# Patient Record
Sex: Male | Born: 2000 | Race: White | Hispanic: No | Marital: Single
Health system: Southern US, Community
[De-identification: ages and names within clinical notes are randomized; demographics above are authoritative.]

---
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
## 2020-06-09 ENCOUNTER — Ambulatory Visit: Payer: PRIVATE HEALTH INSURANCE | Attending: Internal Medicine

## 2020-06-09 DIAGNOSIS — Z20822 Contact with and (suspected) exposure to covid-19: Secondary | ICD-10-CM | POA: Insufficient documentation

## 2020-06-11 LAB — NOVEL CORONAVIRUS, NAA

## 2023-02-12 ENCOUNTER — Encounter (HOSPITAL_BASED_OUTPATIENT_CLINIC_OR_DEPARTMENT_OTHER): Payer: Self-pay | Admitting: Internal Medicine

## 2023-02-12 DIAGNOSIS — G4733 Obstructive sleep apnea (adult) (pediatric): Secondary | ICD-10-CM

## 2023-03-31 ENCOUNTER — Encounter: Payer: Self-pay | Admitting: Nurse Practitioner

## 2023-03-31 ENCOUNTER — Ambulatory Visit
Admission: EM | Admit: 2023-03-31 | Discharge: 2023-03-31 | Disposition: A | Discharge status: Home / Self Care | Attending: Family Medicine | Admitting: Family Medicine

## 2023-03-31 DIAGNOSIS — J101 Influenza due to other identified influenza virus with other respiratory manifestations: Secondary | ICD-10-CM

## 2023-03-31 LAB — POCT INFLUENZA A/B
Influenza A, POC: POSITIVE — AB
Influenza B, POC: NEGATIVE

## 2023-03-31 MED ORDER — OSELTAMIVIR PHOSPHATE 75 MG PO CAPS: 75.0000 mg | ORAL_CAPSULE | Freq: Two times a day (BID) | ORAL | 0 refills | Status: AC

## 2023-03-31 MED ORDER — ONDANSETRON 4 MG PO TBDP
4.0000 mg | ORAL_TABLET | Freq: Once | ORAL | Status: AC
  Administered 2023-03-31: 4 mg via ORAL

## 2023-03-31 MED ORDER — ONDANSETRON 4 MG PO TBDP: 4.0000 mg | ORAL_TABLET | Freq: Three times a day (TID) | ORAL | 0 refills | Status: AC | PRN

## 2023-03-31 NOTE — Discharge Instructions (Signed)
 You have tested positive for influenza A.  Start Tamiflu twice daily for 5 days.  You may continue Zofran every 8 hours as needed for nausea or vomiting.  You were given a dose while in the clinic.  Focus on hydration/electrolyte replacement with Gatorade, Powerade, Pedialyte, water.  Lots of rest.  Follow-up with your PCP in 2 to 3 days for recheck.  Please go to the ER for any worsening symptoms.  Hope you feel better soon!

## 2023-03-31 NOTE — ED Triage Notes (Signed)
 Patient presents to UC for n/v since this morning. States he went to work and had three episodes of vomiting. He states he felt like he was seizing and felt like he could not breathe. No OTC meds.

## 2023-03-31 NOTE — ED Provider Notes (Signed)
 [facility]    CSN: [id number] Arrival date & time: 03/31/23  7829      History   Chief Complaint Chief Complaint  Patient presents with   Nausea   Emesis    HPI Kevin Mcdaniel is a 23 y.o. male  presents for evaluation of URI symptoms that started today.  Patient reports associated symptoms of 3 episodes of nonbilious nonbloody vomiting.  States he had some upset stomach last night with diarrhea but no diarrhea today.  Reports after the vomiting episodes he felt very shaky like he was "seizing" but denies actual seizure activity.  Denies fevers, cough, congestion, sore throat, body aches, shortness of breath.  Denies any recent travel.  No ingestion of contaminated or undercooked foods.  No known sick contacts.  No history of GI diagnoses such as Crohn's, IBS, colitis, diverticulitis or PUD.  Pt has taken nothing OTC for symptoms. Pt has no other concerns at this time.    Emesis Associated symptoms: diarrhea     History reviewed. No pertinent past medical history.  Patient Active Problem List   Diagnosis Date Noted   Conductive hearing loss of right ear with unrestricted hearing of left ear 08/26/2019   Non-recurrent acute serous otitis media of right ear 08/26/2019    History reviewed. No pertinent surgical history.     Home Medications    Prior to Admission medications   Medication Sig Start Date End Date Taking? Authorizing Provider  atomoxetine (STRATTERA) 25 MG capsule TAKE 1 DAILY-NOTIFY PRESCRIBER IMMEDIATELY IF WORSENING SYMPTOMS OR ADVERSE REACTIONS 02/22/22  Yes [provider]  escitalopram (LEXAPRO) 20 MG tablet Take by mouth. 04/10/22  Yes [provider]  lansoprazole (PREVACID) 30 MG capsule Take 1 tablet by mouth daily. 07/28/19  Yes [provider]  ondansetron (ZOFRAN-ODT) 4 MG disintegrating tablet Take 1 tablet (4 mg total) by mouth every 8 (eight) hours as needed for nausea or vomiting. 03/31/23  Yes Radford Pax, NP  oseltamivir (TAMIFLU) 75 MG capsule Take 1 capsule (75 mg total) by mouth every 12 (twelve) hours. 03/31/23  Yes Radford Pax, NP    Family History History reviewed. No pertinent family history.  Social History     Allergies   Patient has no allergy information on record.   Review of Systems Review of Systems  Gastrointestinal:  Positive for diarrhea, nausea and vomiting.     Physical Exam Triage Vital Signs ED Triage Vitals [03/31/23 0921]  Encounter Vitals Group     BP 101/65     Systolic BP Percentile      Diastolic BP Percentile      Pulse Rate 74     Resp 18     Temp 97.8 F (36.6 C)     Temp Source Oral     SpO2 97 %     Weight      Height      Head Circumference      Peak Flow      Pain Score 0     Pain Loc      Pain Education      Exclude from Growth Chart    No data found.  Updated Vital Signs BP 101/65 (BP Location: Right Arm)   Pulse 74   Temp 97.8 F (36.6 C) (Oral)   Resp 18   SpO2 97%   Visual Acuity Right Eye Distance:   Left Eye Distance:   Bilateral Distance:    Right Eye Near:  Left Eye Near:    Bilateral Near:     Physical Exam Vitals and nursing note reviewed.  Constitutional:      General: He is not in acute distress.    Appearance: Normal appearance. He is not ill-appearing.  HENT:     Head: Normocephalic and atraumatic.  Eyes:     Pupils: Pupils are equal, round, and reactive to light.  Cardiovascular:     Rate and Rhythm: Normal rate.  Pulmonary:     Effort: Pulmonary effort is normal.  Abdominal:     General: Bowel sounds are normal. There is no distension.     Palpations: Abdomen is soft.     Tenderness: There is no abdominal tenderness. There is no guarding or rebound.  Skin:    General: Skin is warm and dry.  Neurological:     General: No focal deficit present.     Mental Status: He is alert and oriented to person, place, and time.  Psychiatric:        Mood and Affect: Mood normal.         Behavior: Behavior normal.      UC Treatments / Results  Labs (all labs ordered are listed, but only abnormal results are displayed) Labs Reviewed  POCT INFLUENZA A/B - Abnormal; Notable for the following components:      Result Value   Influenza A, POC Positive (*)    All other components within normal limits    EKG   Radiology No results found.  Procedures Procedures (including critical care time)  Medications Ordered in UC Medications  ondansetron (ZOFRAN-ODT) disintegrating tablet 4 mg (4 mg Oral Given 03/31/23 0924)    Initial Impression / Assessment and Plan / UC Course  I have reviewed the triage vital signs and the nursing notes.  Pertinent labs & imaging results that were available during my care of the patient were reviewed by me and considered in my medical decision making (see chart for details).     Reviewed exam and symptoms with patient.  No red flags.  Positive influenza A.  Will start Tamiflu.  Patient given Zofran in clinic for nausea vomiting and Rx sent to pharmacy.  Discussed fluids and electrolyte replacement with rest and bland diet.  PCP follow-up if symptoms do not improve.  ER precautions reviewed. Final Clinical Impressions(s) / UC Diagnoses   Final diagnoses:  Influenza A     Discharge Instructions      You have tested positive for influenza A.  Start Tamiflu twice daily for 5 days.  You may continue Zofran every 8 hours as needed for nausea or vomiting.  You were given a dose while in the clinic.  Focus on hydration/electrolyte replacement with Gatorade, Powerade, Pedialyte, water.  Lots of rest.  Follow-up with your PCP in 2 to 3 days for recheck.  Please go to the ER for any worsening symptoms.  Hope you feel better soon!     ED Prescriptions     Medication Sig Dispense Auth. Provider   oseltamivir (TAMIFLU) 75 MG capsule Take 1 capsule (75 mg total) by mouth every 12 (twelve) hours. 10 capsule Radford Pax, NP   ondansetron  (ZOFRAN-ODT) 4 MG disintegrating tablet Take 1 tablet (4 mg total) by mouth every 8 (eight) hours as needed for nausea or vomiting. 10 tablet Radford Pax, NP      PDMP not reviewed this encounter.   Radford Pax, NP 03/31/23 [phone]

## 2023-04-02 ENCOUNTER — Ambulatory Visit (HOSPITAL_BASED_OUTPATIENT_CLINIC_OR_DEPARTMENT_OTHER): Payer: PRIVATE HEALTH INSURANCE | Attending: Internal Medicine | Admitting: Internal Medicine

## 2023-04-02 DIAGNOSIS — G4733 Obstructive sleep apnea (adult) (pediatric): Secondary | ICD-10-CM | POA: Diagnosis present

## 2023-04-02 DIAGNOSIS — R0683 Snoring: Secondary | ICD-10-CM | POA: Insufficient documentation

## 2023-04-06 DIAGNOSIS — G4733 Obstructive sleep apnea (adult) (pediatric): Secondary | ICD-10-CM

## 2023-04-06 NOTE — Procedures (Signed)
 [facility] [address] [place], [place] [address] Tel: [phone]   Fax: [phone]  Home Sleep Test Interpretation  Patient Name: Kevin Mcdaniel, Kevin Mcdaniel Date: 04/02/2023  Date of Birth: 2000-10-28 Study Type: HST  Age: 23 year MRN #: [id number]  Sex: Male Interpreting Physician: Jetty Duhamel [id number]  Height: 5\' 5"  Referring Physician: Jamison Oka, MD  Weight: 170.0 lbs Recording Tech: Holly Neeriemer RPSGT RST  BMI: 28.3 Scoring Tech: Holly Neeriemer RPSGT RST   Indications for Polysomnography The patient is a 23 year-old Male who is 5\' 5"  and weighs 170.0 lbs. His BMI equals 28.3.  A home sleep apnea test was performed to evaluate for -.OSA  Medication  None reported   Polysomnogram Data A home sleep test recorded the standard physiologic parameters including EKG, nasal and oral airflow.  Respiratory parameters of chest and abdominal movements were recorded with Respiratory Inductance Plethysmography belts.  Oxygen saturation was recorded by pulse oximetry.   Study Architecture The total recording time of the polysomnogram was 432.4 minutes.  The total monitoring time was 432.5 minutes.  Time spent in Supine position was 237.5 minutes.   Respiratory Events The study revealed a presence of 12 obstructive, 8 central, and - mixed apneas resulting in an Apnea index of 2.8 events per hour.  There were 19 hypopneas (>=3% desaturation and/or arousal) resulting in an Apnea\Hypopnea Index (AHI >=3% desaturation and/or arousal) of 5.4 events per hour.  There were 6 hypopneas (>=4% desaturation) resulting in an Apnea\Hypopnea Index (AHI >=4% desaturation) of 3.6 events per hour.  There were - Respiratory Effort Related Arousals resulting in a RERA index of - events per hour. The Respiratory Disturbance Index is 5.4 events per hour.  The snore index was 0.3 events per hour.  Mean oxygen saturation was 96.6%.  The lowest oxygen saturation  during monitoring time was 93.0%.  Time spent <=88% oxygen saturation was - minutes (-).  Cardiac Summary The average pulse rate was 60.1 bpm.  The minimum pulse rate was 44.0 bpm while the maximum pulse rate was 101.0 bpm.  Cardiac rhythm was normal/abnormal.  Comment: Occasional apneas and hypopneas, within normal limits, AHI (4%) 3.6/hr. Snoring with oxygen desaturation to a nadir of 93% and mean 96.6%.  Diagnosis: Normal study  Recommendations: Maintain normal weight and sleep position off back. If clinical concern persists, consider future repeat study.   This study was personally reviewed and electronically signed by: Jetty Duhamel, MD Accredited Board Certified in Sleep Medicine Date/Time: 04/06/23  12:48   Study Overview  Recording Time: 471.9 min. Monitoring Time: 432.5 min.  Analysis Start:  11:08:42 PM Supine Time: 237.5 min.  Analysis Stop:  06:21:03 AM     Study Summary   Count Index Longest Event Duration  Apneas & Hypopneas: 39 5.4  Apneas: 33.1 sec.     Hypopneas: 41.6 sec.  RERAs: - - - sec.  Desaturations: 31 4.3 172.0 sec.  Snores: 2 0.3 1.6 sec.    Minimum Oxygen Saturation: 93.0%    Respiratory Summary   Total Duration Supine Non-Supine   Count Index Average Longest Count Index Count Index  Obstructive Apnea 12 1.7 18.9 33.1 12 3.0 - -   Mixed Apnea - - - - - - - -   Central Apnea 8 1.1 20.5 31.8 2 0.5 6 1.8   Total Apneas 20 2.8 19.5 33.1 14 3.5 6 1.8            Hypopneas  3% 19 2.6 N.A. N.A. 15 3.8 4 1.2   Apneas & Hyp. 3% 39 5.4 N.A. N.A. 29 7.3 10 3.1            Hypopneas 4% 6 0.8 N.A. N.A. 5 1.3 1 0.3  Apneas & Hyp. 4% 26 3.6 N.A. N.A. 19 4.8 7 2.2             RERAs - - - - - - - -  RDI 41 5.7 N.A. N.A. 30 7.6 11 3.4   Oxygen Saturation Summary   Total Supine Non-Supine  Average SpO2 96.6% 96.8% 96.5%  Minimum SpO2 93.0% 93.0% 93.0%   Maximum SpO2 100.0% 99.0% 100.0%   Oxygen Saturation Distribution  Range (%) Time in range (min)  Time in range (%)  90.0 - 100.0 432.0 100.0%  80.0 - 90.0 - -  70.0 - 80.0 - -  60.0 - 70.0 - -  50.0 - 60.0 - -  0.0 - 50.0 - -  Time Spent <=88% SpO2  Range (%) Time in range (min) Time in range (%)  0.0 - 88.0 - -  Cardiac Summary   Total Supine Non-Supine  Average Pulse Rate (BPM) 60.1 60.4 59.7  Minimum Pulse Rate (BPM) 44.0 46.0 44.0  Maximum Pulse Rate (BPM) 101.0 94.0 101.0                    Comments  -                         Ella Bodo, Biomedical engineer of Sleep Medicine  ELECTRONICALLY SIGNED ON:  04/06/2023, 12:42 PM [facility] PH: [phone]   FX: [phone] ACCREDITED BY THE AMERICAN ACADEMY OF SLEEP MEDICINE

## 2023-04-29 ENCOUNTER — Encounter (HOSPITAL_BASED_OUTPATIENT_CLINIC_OR_DEPARTMENT_OTHER): Payer: Self-pay | Admitting: Emergency Medicine

## 2023-04-29 ENCOUNTER — Emergency Department (HOSPITAL_BASED_OUTPATIENT_CLINIC_OR_DEPARTMENT_OTHER)
Admission: EM | Admit: 2023-04-29 | Discharge: 2023-04-29 | Disposition: A | Discharge status: Home / Self Care | Payer: Worker's Compensation | Attending: Emergency Medicine | Admitting: Emergency Medicine

## 2023-04-29 ENCOUNTER — Other Ambulatory Visit: Payer: Self-pay

## 2023-04-29 ENCOUNTER — Emergency Department (HOSPITAL_BASED_OUTPATIENT_CLINIC_OR_DEPARTMENT_OTHER): Payer: Worker's Compensation | Admitting: Radiology

## 2023-04-29 DIAGNOSIS — Y990 Civilian activity done for income or pay: Secondary | ICD-10-CM | POA: Insufficient documentation

## 2023-04-29 DIAGNOSIS — S61451A Open bite of right hand, initial encounter: Secondary | ICD-10-CM | POA: Insufficient documentation

## 2023-04-29 DIAGNOSIS — S51852A Open bite of left forearm, initial encounter: Secondary | ICD-10-CM | POA: Insufficient documentation

## 2023-04-29 DIAGNOSIS — S61452A Open bite of left hand, initial encounter: Secondary | ICD-10-CM | POA: Diagnosis not present

## 2023-04-29 DIAGNOSIS — W5501XA Bitten by cat, initial encounter: Secondary | ICD-10-CM | POA: Diagnosis not present

## 2023-04-29 DIAGNOSIS — S51851A Open bite of right forearm, initial encounter: Secondary | ICD-10-CM | POA: Diagnosis not present

## 2023-04-29 MED ORDER — BACITRACIN ZINC 500 UNIT/GM EX OINT: TOPICAL_OINTMENT | Freq: Two times a day (BID) | CUTANEOUS | Status: DC

## 2023-04-29 MED ORDER — AMOXICILLIN-POT CLAVULANATE 875-125 MG PO TABS
1.0000 | ORAL_TABLET | Freq: Once | ORAL | Status: AC
  Administered 2023-04-29: 1 via ORAL
  Filled 2023-04-29: qty 1

## 2023-04-29 MED ORDER — AMOXICILLIN-POT CLAVULANATE 875-125 MG PO TABS: 1.0000 | ORAL_TABLET | Freq: Two times a day (BID) | ORAL | 0 refills | Status: AC

## 2023-04-29 NOTE — ED Provider Notes (Signed)
 [place] EMERGENCY DEPARTMENT AT [facility] Provider Note   CSN: [id number] Arrival date & time: 04/29/23  1556     History  Chief Complaint  Patient presents with   Animal Bite    Kevin Mcdaniel is a 23 y.o. male.  23 year old male presents with complaint of cat bite to bilateral forearms and hands.  Patient works as a Museum/gallery conservator, went to take a cat out of a cat carrier when the cat attacked him.  Cat is up-to-date on rabies.  Patient last had a tetanus vaccine in January 2025.  Wounds were immediately cleaned with surgical cleanse at the office.  Presents for evaluation and treatment.  Bleeding is controlled.         Home Medications Prior to Admission medications   Medication Sig Start Date End Date Taking? Authorizing Provider  amoxicillin -clavulanate (AUGMENTIN ) 875-125 MG tablet Take 1 tablet by mouth every 12 (twelve) hours. 04/29/23  Yes Darlis Eisenmenger, PA-C  atomoxetine (STRATTERA) 25 MG capsule TAKE 1 DAILY-NOTIFY PRESCRIBER IMMEDIATELY IF WORSENING SYMPTOMS OR ADVERSE REACTIONS 02/22/22   [provider]  escitalopram (LEXAPRO) 20 MG tablet Take by mouth. 04/10/22   [provider]  lansoprazole (PREVACID) 30 MG capsule Take 1 tablet by mouth daily. 07/28/19   [provider]  ondansetron  (ZOFRAN -ODT) 4 MG disintegrating tablet Take 1 tablet (4 mg total) by mouth every 8 (eight) hours as needed for nausea or vomiting. 03/31/23   Mayer, Jodi R, NP  oseltamivir  (TAMIFLU ) 75 MG capsule Take 1 capsule (75 mg total) by mouth every 12 (twelve) hours. 03/31/23   Mayer, Jodi R, NP      Allergies    Patient has no known allergies.    Review of Systems   Review of Systems Negative except as per HPI Physical Exam Updated Vital Signs BP (!) 144/97 (BP Location: Right Arm)   Pulse (!) 111   Temp 98.9 F (37.2 C) (Oral)   Resp 16   SpO2 100%  Physical Exam Vitals and nursing note reviewed.  Constitutional:      General: He is not in  acute distress.    Appearance: He is well-developed. He is not diaphoretic.  HENT:     Head: Normocephalic and atraumatic.  Cardiovascular:     Pulses: Normal pulses.  Pulmonary:     Effort: Pulmonary effort is normal.  Musculoskeletal:        General: Signs of injury present. No swelling or deformity. Normal range of motion.     Comments: Multiple superficial abrasions and a few small punctures to bilateral forearms and hands, no active bleeding, no gaping wounds.   Skin:    General: Skin is warm and dry.  Neurological:     Mental Status: He is alert and oriented to person, place, and time.     Sensory: No sensory deficit.  Psychiatric:        Behavior: Behavior normal.     ED Results / Procedures / Treatments   Labs (all labs ordered are listed, but only abnormal results are displayed) Labs Reviewed - No data to display  EKG None  Radiology DG Forearm Left Result Date: 04/29/2023 CLINICAL DATA:  Cat bite. 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 EXAM: LEFT FOREARM - 2 VIEW; LEFT HAND - COMPLETE 3+ VIEW COMPARISON:  None Available. FINDINGS: Left forearm: Normal bone mineralization. Joint spaces are preserved. The cortices are intact. No acute fracture or dislocation. No subcutaneous  air. Left hand: Normal bone mineralization. Joint spaces are preserved. Normal alignment. No acute fracture or dislocation. No subcutaneous air. No radiopaque foreign body. IMPRESSION: No acute fracture or dislocation of the left forearm or hand. No radiopaque foreign body. Electronically Signed   By: Bertina Broccoli M.D.   On: 04/29/2023 18:48   DG Hand Complete Left Result Date: 04/29/2023 CLINICAL DATA:  Cat bite. 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 EXAM: LEFT FOREARM - 2 VIEW; LEFT HAND - COMPLETE 3+ VIEW COMPARISON:  None Available. FINDINGS: Left forearm: Normal bone mineralization. Joint spaces are preserved. The  cortices are intact. No acute fracture or dislocation. No subcutaneous air. Left hand: Normal bone mineralization. Joint spaces are preserved. Normal alignment. No acute fracture or dislocation. No subcutaneous air. No radiopaque foreign body. IMPRESSION: No acute fracture or dislocation of the left forearm or hand. No radiopaque foreign body. Electronically Signed   By: Bertina Broccoli M.D.   On: 04/29/2023 18:48   DG Forearm Right Result Date: 04/29/2023 CLINICAL DATA:  Bit and scratched on arms and hands by CT. Patient is a Agricultural engineer. EXAM: RIGHT HAND - COMPLETE 3+ VIEW; RIGHT FOREARM - 2 VIEW COMPARISON:  Right wrist radiographs 03/28/2016 FINDINGS: Right forearm: Normal bone mineralization. The cortices are intact. Joint spaces are preserved. There is lucency, soft tissue swelling, and irregularity consistent with a laceration within the dorsal forearm soft tissues. No acute fracture or dislocation. No radiopaque foreign body is seen. Right hand: Normal bone mineralization. Joint spaces are preserved. No acute fracture or dislocation. No cortical erosion. No subcutaneous air or radiopaque foreign body. IMPRESSION: 1. Laceration within the dorsal forearm soft tissues. No radiopaque foreign body is seen. 2. No acute fracture or dislocation of the right forearm or hand. Electronically Signed   By: Bertina Broccoli M.D.   On: 04/29/2023 18:45   DG Hand Complete Right Result Date: 04/29/2023 CLINICAL DATA:  Bit and scratched on arms and hands by CT. Patient is a Agricultural engineer. EXAM: RIGHT HAND - COMPLETE 3+ VIEW; RIGHT FOREARM - 2 VIEW COMPARISON:  Right wrist radiographs 03/28/2016 FINDINGS: Right forearm: Normal bone mineralization. The cortices are intact. Joint spaces are preserved. There is lucency, soft tissue swelling, and irregularity consistent with a laceration within the dorsal forearm soft tissues. No acute fracture or dislocation. No radiopaque foreign body is seen. Right hand: Normal bone  mineralization. Joint spaces are preserved. No acute fracture or dislocation. No cortical erosion. No subcutaneous air or radiopaque foreign body. IMPRESSION: 1. Laceration within the dorsal forearm soft tissues. No radiopaque foreign body is seen. 2. No acute fracture or dislocation of the right forearm or hand. Electronically Signed   By: Bertina Broccoli M.D.   On: 04/29/2023 18:45    Procedures Procedures    Medications Ordered in ED Medications  bacitracin  ointment (has no administration in time range)  amoxicillin -clavulanate (AUGMENTIN ) 875-125 MG per tablet 1 tablet (1 tablet Oral Given 04/29/23 1842)    ED Course/ Medical Decision Making/ A&P                                 Medical Decision Making Amount and/or Complexity of Data Reviewed Radiology: ordered.  Risk OTC drugs. Prescription drug management.   23 year old male with cat bite to bilateral forearms, cleaned thoroughly at the vet clinic prior to coming to the ER today.  Animal is up  to date on rabies vaccine and animal control form has been filed.  Patient's tetanus is up-to-date.  Plan is to obtain x-rays to evaluate for retained foreign body.  Likely discharge with Augmentin , wound check in 2 days and return precautions.        Final Clinical Impression(s) / ED Diagnoses Final diagnoses:  Cat bite, initial encounter    Rx / DC Orders ED Discharge Orders          Ordered    amoxicillin -clavulanate (AUGMENTIN ) 875-125 MG tablet  Every 12 hours        04/29/23 1629              Darlis Eisenmenger, PA-C 04/29/23 1949    Sallyanne Creamer, DO 05/04/23 1333

## 2023-04-29 NOTE — Discharge Instructions (Signed)
 Take Augmentin as prescribed and complete the full course. Take Motrin and Tylenol as needed as directed for pain. Keep wounds clean and dry. Recheck with Worker's Comp. provider in 2 days.

## 2023-04-29 NOTE — ED Triage Notes (Signed)
 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

## 2023-08-19 NOTE — Procedures (Signed)
 Kevin Mcdaniel
